# Patient Record
Sex: Female | Born: 2005 | Race: White | Hispanic: No | Marital: Single | Smoking: Never smoker
Health system: Southern US, Community
[De-identification: ages and names within clinical notes are randomized; demographics above are authoritative.]

## PROBLEM LIST (undated history)

## (undated) DIAGNOSIS — N398 Other specified disorders of urinary system: Secondary | ICD-10-CM

## (undated) DIAGNOSIS — N390 Urinary tract infection, site not specified: Secondary | ICD-10-CM

## (undated) DIAGNOSIS — F902 Attention-deficit hyperactivity disorder, combined type: Secondary | ICD-10-CM

## (undated) DIAGNOSIS — IMO0002 Reserved for concepts with insufficient information to code with codable children: Secondary | ICD-10-CM

## (undated) DIAGNOSIS — N189 Chronic kidney disease, unspecified: Secondary | ICD-10-CM

## (undated) DIAGNOSIS — Q6431 Congenital bladder neck obstruction: Secondary | ICD-10-CM

## (undated) DIAGNOSIS — R278 Other lack of coordination: Secondary | ICD-10-CM

## (undated) DIAGNOSIS — I10 Essential (primary) hypertension: Secondary | ICD-10-CM

## (undated) DIAGNOSIS — Q631 Lobulated, fused and horseshoe kidney: Secondary | ICD-10-CM

## (undated) DIAGNOSIS — O30009 Twin pregnancy, unspecified number of placenta and unspecified number of amniotic sacs, unspecified trimester: Secondary | ICD-10-CM

## (undated) HISTORY — DX: Reserved for concepts with insufficient information to code with codable children: IMO0002

## (undated) HISTORY — PX: KIDNEY SURGERY: SHX687

## (undated) HISTORY — DX: Essential (primary) hypertension: I10

## (undated) HISTORY — DX: Urinary tract infection, site not specified: N39.0

## (undated) HISTORY — DX: Chronic kidney disease, unspecified: N18.9

## (undated) HISTORY — DX: Other specified disorders of urinary system: N39.8

## (undated) HISTORY — DX: Other lack of coordination: R27.8

## (undated) HISTORY — DX: Lobulated, fused and horseshoe kidney: Q63.1

## (undated) HISTORY — DX: Attention-deficit hyperactivity disorder, combined type: F90.2

## (undated) HISTORY — DX: Twin pregnancy, unspecified number of placenta and unspecified number of amniotic sacs, unspecified trimester: O30.009

## (undated) HISTORY — DX: Congenital bladder neck obstruction: Q64.31

---
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
## 2024-01-21 DIAGNOSIS — L600 Ingrowing nail: Secondary | ICD-10-CM | POA: Diagnosis not present

## 2024-09-14 DIAGNOSIS — J4540 Moderate persistent asthma, uncomplicated: Secondary | ICD-10-CM | POA: Diagnosis not present

## 2024-09-14 DIAGNOSIS — Z1331 Encounter for screening for depression: Secondary | ICD-10-CM | POA: Diagnosis not present

## 2024-09-14 DIAGNOSIS — Z3041 Encounter for surveillance of contraceptive pills: Secondary | ICD-10-CM | POA: Diagnosis not present

## 2024-09-14 DIAGNOSIS — F419 Anxiety disorder, unspecified: Secondary | ICD-10-CM | POA: Diagnosis not present

## 2024-11-02 DIAGNOSIS — F32A Depression, unspecified: Secondary | ICD-10-CM | POA: Diagnosis not present

## 2024-11-02 DIAGNOSIS — F419 Anxiety disorder, unspecified: Secondary | ICD-10-CM | POA: Diagnosis not present
